# Patient Record
Sex: Female | Born: 2000 | Race: Black or African American | Hispanic: No | Marital: Single | Smoking: Never smoker
Health system: Southern US, Community
[De-identification: ages and names within clinical notes are randomized; demographics above are authoritative.]

## PROBLEM LIST (undated history)

## (undated) DIAGNOSIS — J302 Other seasonal allergic rhinitis: Secondary | ICD-10-CM

## (undated) DIAGNOSIS — F32A Depression, unspecified: Secondary | ICD-10-CM

## (undated) HISTORY — DX: Depression, unspecified: F32.A

## (undated) HISTORY — PX: DENTAL SURGERY: SHX609

---
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
## 2022-08-19 ENCOUNTER — Ambulatory Visit
Admission: EM | Admit: 2022-08-19 | Discharge: 2022-08-19 | Disposition: A | Discharge status: Home / Self Care | Payer: Medicaid Other | Attending: Family Medicine | Admitting: Family Medicine

## 2022-08-19 DIAGNOSIS — J309 Allergic rhinitis, unspecified: Secondary | ICD-10-CM

## 2022-08-19 DIAGNOSIS — R059 Cough, unspecified: Secondary | ICD-10-CM

## 2022-08-19 MED ORDER — FEXOFENADINE HCL 180 MG PO TABS: 180.0000 mg | ORAL_TABLET | Freq: Every day | ORAL | 0 refills | Status: AC

## 2022-08-19 MED ORDER — BENZONATATE 200 MG PO CAPS: 200.0000 mg | ORAL_CAPSULE | Freq: Three times a day (TID) | ORAL | 0 refills | Status: AC | PRN

## 2022-08-19 MED ORDER — CEFDINIR 300 MG PO CAPS: 300.0000 mg | ORAL_CAPSULE | Freq: Two times a day (BID) | ORAL | 0 refills | Status: AC

## 2022-08-19 NOTE — Discharge Instructions (Addendum)
Advised patient to take medication as directed with food to completion.  Advised patient to take Allegra with first dose of cefdinir for the next 5 of 7 days.  Advised may use Allegra as needed afterwards for concurrent postnasal drainage/drip.  Advised may use Tessalon daily or as needed for cough.  Encouraged  patient increase daily water intake to 64 ounces per day while taking these medications.  Advised if symptoms worsen and/or unresolved please follow-up with PCP or here for further evaluation.

## 2022-08-19 NOTE — ED Provider Notes (Signed)
[name]    CSN: [id number] Arrival date & time: 08/19/22  1118      History   Chief Complaint Chief Complaint  Patient presents with   Cough    Cough, sneezing, headache and nasal congestion. X4 days    HPI Chelsea Sims is a 22 y.o. female.   HPI 22 year old female presents with coughing, sneezing, headache and nasal congestion for 4 days.  PMH significant for obesity, dysmenorrhea and depression.  Past Medical History:  Diagnosis Date   Depression    Phreesia 10/15/2020   Seasonal allergies     Patient Active Problem List   Diagnosis Date Noted   Right hand pain 12/11/2020   Adjustment disorder with mixed anxiety and depressed mood 10/26/2020   Menorrhagia with regular cycle 10/26/2020   Dysmenorrhea 10/26/2020    Past Surgical History:  Procedure Laterality Date   DENTAL SURGERY      OB History   No obstetric history on file.      Home Medications    Prior to Admission medications   Medication Sig Start Date End Date Taking? Authorizing Provider  benzonatate (TESSALON) 200 MG capsule Take 1 capsule (200 mg total) by mouth 3 (three) times daily as needed for up to 7 days. 08/19/22 08/26/22 Yes Eliezer Lofts, FNP  cefdinir (OMNICEF) 300 MG capsule Take 1 capsule (300 mg total) by mouth 2 (two) times daily for 7 days. 08/19/22 08/26/22 Yes Eliezer Lofts, FNP  fexofenadine [facility] ALLERGY) 180 MG tablet Take 1 tablet (180 mg total) by mouth daily for 15 days. 08/19/22 09/03/22 Yes Eliezer Lofts, FNP  levonorgestrel-ethinyl estradiol (AVIANE) 0.1-20 MG-MCG tablet Take 1 tablet by mouth daily. 10/16/20   Trude Mcburney, FNP    Family History Family History  Problem Relation Age of Onset   Hypertension Mother    Hypertension Father     Social History Social History   Tobacco Use   Smoking status: Never   Smokeless tobacco: Never  Vaping Use   Vaping Use: Never used  Substance Use Topics   Alcohol use: No   Drug use: No      Allergies   Patient has no known allergies.   Review of Systems Review of Systems  HENT:  Positive for congestion, postnasal drip and rhinorrhea.   Neurological:  Positive for headaches.     Physical Exam Triage Vital Signs ED Triage Vitals  Enc Vitals Group     BP 08/19/22 1334 121/85     Pulse Rate 08/19/22 1334 85     Resp 08/19/22 1334 20     Temp 08/19/22 1334 98.3 F (36.8 C)     Temp Source 08/19/22 1334 Oral     SpO2 08/19/22 1334 94 %     Weight 08/19/22 1331 201 lb (91.2 kg)     Height 08/19/22 1331 5' (1.524 m)     Head Circumference --      Peak Flow --      Pain Score 08/19/22 1331 0     Pain Loc --      Pain Edu? --      Excl. in [place]? --    No data found.  Updated Vital Signs BP 121/85 (BP Location: Right Arm)   Pulse 85   Temp 98.3 F (36.8 C) (Oral)   Resp 20   Ht 5' (1.524 m)   Wt 201 lb (91.2 kg)   LMP 08/05/2022   SpO2 94%   BMI 39.26 kg/m  Visual Acuity Right Eye Distance:   Left Eye Distance:   Bilateral Distance:    Right Eye Near:   Left Eye Near:    Bilateral Near:     Physical Exam Vitals and nursing note reviewed.  Constitutional:      General: She is not in acute distress.    Appearance: Normal appearance. She is obese. She is not ill-appearing.  HENT:     Head: Normocephalic and atraumatic.     Right Ear: Tympanic membrane, ear canal and external ear normal.     Left Ear: Tympanic membrane, ear canal and external ear normal.     Nose: Nose normal.     Mouth/Throat:     Mouth: Mucous membranes are moist.     Pharynx: Oropharynx is clear.     Comments: Significant amount of clear drainage of posterior oropharynx noted Eyes:     Extraocular Movements: Extraocular movements intact.     Conjunctiva/sclera: Conjunctivae normal.     Pupils: Pupils are equal, round, and reactive to light.  Cardiovascular:     Rate and Rhythm: Normal rate and regular rhythm.     Pulses: Normal pulses.     Heart sounds: Normal heart  sounds. No murmur heard. Pulmonary:     Effort: Pulmonary effort is normal.     Breath sounds: Normal breath sounds. No wheezing, rhonchi or rales.     Comments: Infrequent nonproductive cough noted on exam Musculoskeletal:        General: Normal range of motion.     Cervical back: Normal range of motion and neck supple.  Skin:    General: Skin is warm and dry.  Neurological:     General: No focal deficit present.     Mental Status: She is alert and oriented to person, place, and time. Mental status is at baseline.      UC Treatments / Results  Labs (all labs ordered are listed, but only abnormal results are displayed) Labs Reviewed - No data to display  EKG   Radiology No results found.  Procedures Procedures (including critical care time)  Medications Ordered in UC Medications - No data to display  Initial Impression / Assessment and Plan / UC Course  I have reviewed the triage vital signs and the nursing notes.  Pertinent labs & imaging results that were available during my care of the patient were reviewed by me and considered in my medical decision making (see chart for details).     MDM: 1. Cough-Rx'd Cefdinir, Tessalon; 2.  Allergic rhinitis-Rx'd Allegra. Advised patient to take medication as directed with food to completion.  Advised patient to take Allegra with first dose of cefdinir for the next 5 of 7 days.  Advised may use Allegra as needed afterwards for concurrent postnasal drainage/drip.  Advised may use Tessalon daily or as needed for cough.  Encouraged patient increase daily water intake to 64 ounces per day while taking these medications.  Advised if symptoms worsen and/or unresolved please follow-up with PCP or here for further evaluation.  Patient discharged home, hemodynamically stable. Final Clinical Impressions(s) / UC Diagnoses   Final diagnoses:  Cough, unspecified type  Allergic rhinitis, unspecified seasonality, unspecified trigger      Discharge Instructions      Advised patient to take medication as directed with food to completion.  Advised patient to take Allegra with first dose of cefdinir for the next 5 of 7 days.  Advised may use Allegra as needed afterwards for concurrent  postnasal drainage/drip.  Advised may use Tessalon daily or as needed for cough.  Encouraged  patient increase daily water intake to 64 ounces per day while taking these medications.  Advised if symptoms worsen and/or unresolved please follow-up with PCP or here for further evaluation.     ED Prescriptions     Medication Sig Dispense Auth. Provider   cefdinir (OMNICEF) 300 MG capsule Take 1 capsule (300 mg total) by mouth 2 (two) times daily for 7 days. 14 capsule Eliezer Lofts, FNP   benzonatate (TESSALON) 200 MG capsule Take 1 capsule (200 mg total) by mouth 3 (three) times daily as needed for up to 7 days. 40 capsule Eliezer Lofts, FNP   fexofenadine [facility] ALLERGY) 180 MG tablet Take 1 tablet (180 mg total) by mouth daily for 15 days. 15 tablet Eliezer Lofts, FNP      PDMP not reviewed this encounter.   Eliezer Lofts, [place] 08/19/22 1434

## 2022-08-19 NOTE — ED Triage Notes (Signed)
Pt states that she has a cough, headache, sneezing, and some nasal congestion. X4 days

## 2024-04-06 ENCOUNTER — Telehealth: Payer: Self-pay | Admitting: *Deleted

## 2024-04-06 NOTE — Telephone Encounter (Signed)
 Chelsea Sims needs a copy of her records call her at [phone].
